# Patient Record
Sex: Male | Born: 1970
Health system: Southern US, Community
[De-identification: ages and names within clinical notes are randomized; demographics above are authoritative.]

## PROBLEM LIST (undated history)

## (undated) ENCOUNTER — Ambulatory Visit: Discharge status: Absent without leave | Payer: BC Managed Care – PPO

## (undated) DIAGNOSIS — M2540 Effusion, unspecified joint: Secondary | ICD-10-CM

## (undated) DIAGNOSIS — M2550 Pain in unspecified joint: Secondary | ICD-10-CM

## (undated) DIAGNOSIS — Z8709 Personal history of other diseases of the respiratory system: Secondary | ICD-10-CM

## (undated) HISTORY — PX: COLONOSCOPY: SHX174

---

## 2002-06-08 ENCOUNTER — Emergency Department (HOSPITAL_COMMUNITY): Admission: EM | Admit: 2002-06-08 | Discharge: 2002-06-08 | Payer: Self-pay | Admitting: Emergency Medicine

## 2005-06-23 ENCOUNTER — Emergency Department (HOSPITAL_COMMUNITY): Admission: EM | Admit: 2005-06-23 | Discharge: 2005-06-23 | Payer: Self-pay | Admitting: *Deleted

## 2006-04-03 ENCOUNTER — Ambulatory Visit: Payer: Self-pay | Admitting: Internal Medicine

## 2006-04-09 ENCOUNTER — Ambulatory Visit (HOSPITAL_COMMUNITY): Admission: RE | Admit: 2006-04-09 | Discharge: 2006-04-09 | Payer: Self-pay | Admitting: Internal Medicine

## 2006-04-09 ENCOUNTER — Ambulatory Visit: Payer: Self-pay | Admitting: Internal Medicine

## 2012-03-24 ENCOUNTER — Emergency Department (HOSPITAL_COMMUNITY)
Admission: EM | Admit: 2012-03-24 | Discharge: 2012-03-24 | Disposition: A | Discharge status: Home / Self Care | Payer: Worker's Compensation | Attending: Emergency Medicine | Admitting: Emergency Medicine

## 2012-03-24 ENCOUNTER — Emergency Department (HOSPITAL_COMMUNITY): Payer: Worker's Compensation

## 2012-03-24 ENCOUNTER — Encounter (HOSPITAL_COMMUNITY): Payer: Self-pay | Admitting: Emergency Medicine

## 2012-03-24 DIAGNOSIS — Z87891 Personal history of nicotine dependence: Secondary | ICD-10-CM | POA: Insufficient documentation

## 2012-03-24 DIAGNOSIS — W12XXXA Fall on and from scaffolding, initial encounter: Secondary | ICD-10-CM | POA: Insufficient documentation

## 2012-03-24 DIAGNOSIS — S43016A Anterior dislocation of unspecified humerus, initial encounter: Secondary | ICD-10-CM | POA: Insufficient documentation

## 2012-03-24 DIAGNOSIS — S43015A Anterior dislocation of left humerus, initial encounter: Secondary | ICD-10-CM

## 2012-03-24 DIAGNOSIS — Y929 Unspecified place or not applicable: Secondary | ICD-10-CM | POA: Insufficient documentation

## 2012-03-24 DIAGNOSIS — Y939 Activity, unspecified: Secondary | ICD-10-CM | POA: Insufficient documentation

## 2012-03-24 DIAGNOSIS — S42309A Unspecified fracture of shaft of humerus, unspecified arm, initial encounter for closed fracture: Secondary | ICD-10-CM

## 2012-03-24 MED ORDER — MIDAZOLAM HCL 2 MG/2ML IJ SOLN: 4.0000 mg | Freq: Once | INTRAMUSCULAR | Status: DC

## 2012-03-24 MED ORDER — PROPOFOL 10 MG/ML IV EMUL
INTRAVENOUS | Status: AC | PRN
  Administered 2012-03-24: 20 mL via INTRAVENOUS

## 2012-03-24 MED ORDER — MIDAZOLAM HCL 2 MG/2ML IJ SOLN
INTRAMUSCULAR | Status: AC
  Filled 2012-03-24: qty 8

## 2012-03-24 MED ORDER — MIDAZOLAM HCL 2 MG/2ML IJ SOLN
INTRAMUSCULAR | Status: AC | PRN
  Administered 2012-03-24: 4 mg via INTRAVENOUS

## 2012-03-24 MED ORDER — METHOCARBAMOL 500 MG PO TABS: 500.0000 mg | ORAL_TABLET | Freq: Three times a day (TID) | ORAL | Status: DC | PRN

## 2012-03-24 MED ORDER — PROPOFOL 10 MG/ML IV BOLUS
INTRAVENOUS | Status: AC | PRN
  Administered 2012-03-24: 20 mg via INTRAVENOUS

## 2012-03-24 MED ORDER — MORPHINE SULFATE 4 MG/ML IJ SOLN
6.0000 mg | Freq: Once | INTRAMUSCULAR | Status: AC
  Administered 2012-03-24: 6 mg via INTRAVENOUS
  Filled 2012-03-24: qty 2

## 2012-03-24 MED ORDER — OXYCODONE-ACETAMINOPHEN 5-325 MG PO TABS: 1.0000 | ORAL_TABLET | Freq: Four times a day (QID) | ORAL | Status: DC | PRN

## 2012-03-24 MED ORDER — ONDANSETRON HCL 4 MG/2ML IJ SOLN
4.0000 mg | Freq: Once | INTRAMUSCULAR | Status: AC
  Administered 2012-03-24: 4 mg via INTRAVENOUS
  Filled 2012-03-24: qty 2

## 2012-03-24 MED ORDER — HYDROMORPHONE HCL PF 1 MG/ML IJ SOLN
1.0000 mg | Freq: Once | INTRAMUSCULAR | Status: AC
  Administered 2012-03-24: 1 mg via INTRAVENOUS
  Filled 2012-03-24: qty 1

## 2012-03-24 MED ORDER — PROPOFOL 10 MG/ML IV BOLUS
INTRAVENOUS | Status: AC
  Filled 2012-03-24: qty 20

## 2012-03-24 NOTE — ED Notes (Signed)
Pt needed to use restroom. Notified of need to go over  D/c paperwork.  Pt left without d/c orders being completely explained, only partially.

## 2012-03-24 NOTE — Consult Note (Signed)
Reason for Consult:Left shoulder dislocation Referring Physician: EDP  DAREON NUNZIATO is an 42 y.o. male.  HPI: 42 yo male who fell at [employer] today sustaining a left shoulder dislocation.  Patient unable to move the arm after the fall. Patient complains of numbness on the lateral arm.  Patient transported to [facility] ED for further eval and treatment.  History reviewed. No pertinent past medical history.  History reviewed. No pertinent past surgical history.  No family history on file.  Social History:  reports that he has quit smoking. He does not have any smokeless tobacco history on file. He reports that he drinks alcohol. He reports that he does not use illicit drugs.  Allergies: No Known Allergies  Medications: I have reviewed the patient's current medications.  No results found for this or any previous visit (from the past 48 hour(s)).  Dg Cervical Spine Complete  03/24/2012  *RADIOLOGY REPORT*  Clinical Data: Fall with severe left sided pain.  CERVICAL SPINE - COMPLETE 4+ VIEW  Comparison: None.  Findings: Five views study shows no evidence for an acute fracture or subluxation.  Swimmer's view cannot be obtained secondary to left shoulder injury.  Loss of disc height is seen at C5-6 and C6-7 with associated endplate degeneration.  The frontal and bilateral oblique films show normal alignment at the C7-T1 interspace. The facets are well-aligned bilaterally.  No evidence for prevertebral soft tissue swelling.  IMPRESSION: No evidence for acute cervical spine fracture.   Original Report Authenticated By: Kennith Center, M.D.    Dg Shoulder Left  03/24/2012  *RADIOLOGY REPORT*  Clinical Data: Fall with left shoulder pain.  LEFT SHOULDER - 2+ VIEW  Comparison: None.  Findings: Two-view exam shows evidence of anterior subcoracoid humeral head dislocation with associated fracture of the humeral head against the glenoid.  IMPRESSION: Anterior shoulder dislocation with associated  comminuted humeral head fracture.   Original Report Authenticated By: Kennith Center, M.D.    Dg Shoulder Left Port  03/24/2012  *RADIOLOGY REPORT*  Clinical Data: Reduction of left shoulder dislocation.  PORTABLE LEFT SHOULDER - 2+ VIEW  Comparison: Earlier film, same date.  Findings: Interval reduction of the humeral head dislocation.  A greater tuberosity fracture is noted.  IMPRESSION: Reduction of left shoulder dislocation. Mildly displaced greater tuberosity fracture.   Original Report Authenticated By: Rudie Meyer, M.D.     ROS Blood pressure 164/110, pulse 95, temperature 98.3 F (36.8 C), temperature source Oral, resp. rate 21, SpO2 98.00%. Physical Exam  Left shoulder moderately swollen and tender. Incomplete numbness in the axillary nerve distribution. Able to fire the deltoid against resistance but weak and with some pain. Shoulder is clinically located. Shoulder is in a sling. NVI distally.  Assessment/Plan: Left shoulder dislocation with greater tuberosity fracture. After the reduction of the shoulder by the EDP, the position of the fractured tuberosity is much improved, however it is still minimally displaced.  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  I am recommending a CT scan to evaluate the amount of residual displacement.  They have had a long day and would like to go home.  I suggested that we can get the scan done in [place] where they are from and I can call them with the results and to discuss the plan for the shoulder.  All are in agreement.  Discussed with the EDP Dr Rubin Payor who is providing pain meds and muscle relaxers. Patient's cell phone is [phone]  Cautioned the patient to keep his arm across his body in the sling and gave them my card for contact info.  Vicy Medico,STEVEN R 03/24/2012, 5:48 PM

## 2012-03-24 NOTE — ED Notes (Signed)
Stepped thru some scaffoling and he hurt  Left arm . Landed on left shoulder good pulse  And can wiggle fingers but cannot move arm

## 2012-03-24 NOTE — ED Notes (Signed)
Patient transported to X-ray 

## 2012-03-24 NOTE — ED Notes (Signed)
Great pain to L shoulder with deformity.  No obvious broken bones.  Radial/unlnar pulses present.  + cap refill.  Pt denies loc or neck pain.  Pt did not fall through flooring.  AO x 4.

## 2012-03-24 NOTE — ED Provider Notes (Signed)
History     CSN: [id number]  Arrival date & time 03/24/12  1111   First MD Initiated Contact with Patient 03/24/12 1146      No chief complaint on file.   (Consider location/radiation/quality/duration/timing/severity/associated sxs/prior treatment) The history is provided by the patient.  Noah Lawson is a 42 y.o. male here status post fall. He was about 5 feet up on a scaffolding and stepped in a hole and fell off. He landed on his left arm and had a lot of pain afterwards. No head injury or LOC. It otherwise healthy the   History reviewed. No pertinent past medical history.  History reviewed. No pertinent past surgical history.  No family history on file.  History  Substance Use Topics  . Smoking status: Former Games developer  . Smokeless tobacco: Not on file  . Alcohol Use: Yes     Comment: weekend drinker      Review of Systems  Musculoskeletal:       L shoulder pain   All other systems reviewed and are negative.    Allergies  Review of patient's allergies indicates no known allergies.  Home Medications  No current outpatient prescriptions on file.  BP 164/110  Pulse 95  Temp 98.3 F (36.8 C) (Oral)  Resp 21  SpO2 98%  Physical Exam  Nursing note and vitals reviewed. Constitutional: He is oriented to person, place, and time. He appears well-developed.       Uncomfortable   HENT:  Head: Normocephalic and atraumatic.  Mouth/Throat: Oropharynx is clear and moist.  Eyes: Conjunctivae normal are normal. Pupils are equal, round, and reactive to light.  Neck: Normal range of motion. Neck supple.       No midline tenderness   Cardiovascular: Normal rate, regular rhythm and normal heart sounds.   Pulmonary/Chest: Effort normal and breath sounds normal. No respiratory distress. He has no wheezes. He has no rales.  Abdominal: Soft. Bowel sounds are normal. He exhibits no distension. There is no tenderness. There is no rebound.  Musculoskeletal:       L humeral  head displaced anteriorly and inferiorly. Good sensation around shoulder. 2+ pulses. Able to wiggle fingers. No other extremity deformity  Neurological: He is alert and oriented to person, place, and time.  Skin: Skin is warm and dry.  Psychiatric: He has a normal mood and affect. His behavior is normal. Judgment and thought content normal.    ED Course  Reduction of dislocation Date/Time: 03/24/2012 2:20 PM Performed by: Richardean Canal Authorized by: Richardean Canal Consent: Verbal consent obtained. Written consent obtained. Risks and benefits: risks, benefits and alternatives were discussed Consent given by: patient Patient understanding: patient states understanding of the procedure being performed Patient consent: the patient's understanding of the procedure matches consent given Procedure consent: procedure consent matches procedure scheduled Relevant documents: relevant documents present and verified Test results: test results available and properly labeled Site marked: the operative site was marked Imaging studies: imaging studies available Patient identity confirmed: verbally with patient and arm band Patient sedated: yes Sedatives: see MAR for details Patient tolerance: Patient tolerated the procedure well with no immediate complications. Comments: Traction and counter traction used. Shoulder successfully reduced. No complications.    (including critical care time)  Procedural sedation Performed by: Silverio Lay, Aanvi Voyles Consent: Verbal consent obtained. Risks and benefits: risks, benefits and alternatives were discussed Required items: required blood products, implants, devices, and special equipment available Patient identity confirmed: arm band and provided demographic data Time  out: Immediately prior to procedure a "time out" was called to verify the correct patient, procedure, equipment, support staff and site/side marked as required.  Sedation type: moderate (conscious)  sedation NPO time confirmed and considedered  Sedatives: PROPOFOL  Physician Time at Bedside: 40 min   Vitals: Vital signs were monitored during sedation. Cardiac Monitor, pulse oximeter Patient tolerance: Patient tolerated the procedure well with no immediate complications. Comments: Pt with uneventful recovered. Returned to pre-procedural sedation baseline  Comments: I gave 220 mg in total in 20mg  aliquots. He was still not sedated enough so he was given versed 4mg  IV then was sedated.         Labs Reviewed - No data to display Dg Cervical Spine Complete  03/24/2012  *RADIOLOGY REPORT*  Clinical Data: Fall with severe left sided pain.  CERVICAL SPINE - COMPLETE 4+ VIEW  Comparison: None.  Findings: Five views study shows no evidence for an acute fracture or subluxation.  Swimmer's view cannot be obtained secondary to left shoulder injury.  Loss of disc height is seen at C5-6 and C6-7 with associated endplate degeneration.  The frontal and bilateral oblique films show normal alignment at the C7-T1 interspace. The facets are well-aligned bilaterally.  No evidence for prevertebral soft tissue swelling.  IMPRESSION: No evidence for acute cervical spine fracture.   Original Report Authenticated By: Kennith Center, M.D.    Dg Shoulder Left  03/24/2012  *RADIOLOGY REPORT*  Clinical Data: Fall with left shoulder pain.  LEFT SHOULDER - 2+ VIEW  Comparison: None.  Findings: Two-view exam shows evidence of anterior subcoracoid humeral head dislocation with associated fracture of the humeral head against the glenoid.  IMPRESSION: Anterior shoulder dislocation with associated comminuted humeral head fracture.   Original Report Authenticated By: Kennith Center, M.D.    Dg Shoulder Left Port  03/24/2012  *RADIOLOGY REPORT*  Clinical Data: Reduction of left shoulder dislocation.  PORTABLE LEFT SHOULDER - 2+ VIEW  Comparison: Earlier film, same date.  Findings: Interval reduction of the humeral head  dislocation.  A greater tuberosity fracture is noted.  IMPRESSION: Reduction of left shoulder dislocation. Mildly displaced greater tuberosity fracture.   Original Report Authenticated By: Rudie Meyer, M.D.      No diagnosis found.    MDM  Noah Lawson is a 42 y.o. male here with L shoulder pain s/p fall. Likely dislocation vs fracture. Will give pain meds and get xray and reassess.   1:20 PM Xray showed anterior shoulder dislocation with humeral head fracture. I talked to Dr. Ranell Patrick, who asked me to reduce the shoulder and call him back after repeat films and he will see the patient.   2 PM I performed conscious sedation as above. Reduced shoulder without any complications.   3:35 PM Repeat films obtained, I called Dr. Ranell Patrick again, who will see the patient. Dr. Rubin Payor in the ED aware.        Richardean Canal, MD 03/24/12 [phone]

## 2012-03-25 ENCOUNTER — Encounter (HOSPITAL_COMMUNITY): Payer: Self-pay | Admitting: Pharmacy Technician

## 2012-03-27 ENCOUNTER — Encounter (HOSPITAL_COMMUNITY)
Admission: RE | Admit: 2012-03-27 | Discharge: 2012-03-27 | Disposition: A | Discharge status: Home / Self Care | Payer: Worker's Compensation | Source: Ambulatory Visit | Attending: Orthopedic Surgery | Admitting: Orthopedic Surgery

## 2012-03-27 ENCOUNTER — Encounter (HOSPITAL_COMMUNITY): Payer: Self-pay

## 2012-03-27 DIAGNOSIS — Z01812 Encounter for preprocedural laboratory examination: Secondary | ICD-10-CM | POA: Insufficient documentation

## 2012-03-27 HISTORY — DX: Pain in unspecified joint: M25.50

## 2012-03-27 HISTORY — DX: Gilbert syndrome: E80.4

## 2012-03-27 HISTORY — DX: Effusion, unspecified joint: M25.40

## 2012-03-27 HISTORY — DX: Personal history of other diseases of the respiratory system: Z87.09

## 2012-03-27 LAB — CBC WITH DIFFERENTIAL/PLATELET
Eosinophils Relative: 7 % — ABNORMAL HIGH (ref 0–5)
Hemoglobin: 14.6 g/dL (ref 13.0–17.0)
Lymphocytes Relative: 25 % (ref 12–46)
Lymphs Abs: 1.4 10*3/uL (ref 0.7–4.0)
MCV: 97.7 fL (ref 78.0–100.0)
Monocytes Relative: 15 % — ABNORMAL HIGH (ref 3–12)
Neutrophils Relative %: 53 % (ref 43–77)
Platelets: 204 10*3/uL (ref 150–400)
RBC: 4.35 MIL/uL (ref 4.22–5.81)
WBC: 5.4 10*3/uL (ref 4.0–10.5)

## 2012-03-27 MED ORDER — CEFAZOLIN SODIUM-DEXTROSE 2-3 GM-% IV SOLR: 2.0000 g | INTRAVENOUS | Status: DC

## 2012-03-27 MED ORDER — CHLORHEXIDINE GLUCONATE 4 % EX LIQD: 60.0000 mL | Freq: Once | CUTANEOUS | Status: DC

## 2012-03-27 NOTE — Progress Notes (Signed)
Pt doesn't have a cardiologist  Denies ever having an echo/stress test/heart cath  Pt doesn't have a medical md  Denies ekg or cxr within past yr 

## 2012-03-27 NOTE — H&P (Signed)
  Noah Lawson is an 42 y.o. male.    Chief Complaint: left shoulder pain  HPI: Pt is a 42 y.o. male complaining of left shoulder pain s/p fall and dislocation/fracture.Pt had closed reduction of dislocation in the emergency department. Risks, benefits and expectations were discussed with the patient. Patient understand the risks, benefits and expectations and wishes to proceed with surgery.   PCP:  Default, Provider, MD  D/C Plans: Home   PMH: No past medical history on file.  PSH: No past surgical history on file.  Social History:  reports that he has quit smoking. He does not have any smokeless tobacco history on file. He reports that he drinks alcohol. He reports that he does not use illicit drugs.  Allergies:  No Known Allergies  Medications: No current facility-administered medications for this encounter.   Current Outpatient Prescriptions  Medication Sig Dispense Refill  . methocarbamol (ROBAXIN) 500 MG tablet Take 1 tablet (500 mg total) by mouth 3 (three) times daily as needed.  20 tablet  0  . oxyCODONE-acetaminophen (PERCOCET/ROXICET) 5-325 MG per tablet Take 1-2 tablets by mouth every 6 (six) hours as needed for pain.  20 tablet  0    No results found for this or any previous visit (from the past 48 hour(s)). No results found.  ROS: Pain with rom of the left upper extremity Otherwise ros negative  Physical Exam: Alert and oriented 42 y.o. male in no acute distress Cranial nerves 2-12 intact Cervical spine: full rom with no tenderness, nv intact distally Chest: active breath sounds bilaterally, no wheeze rhonchi or rales Heart: regular rate and rhythm, no murmur Abd: non tender non distended with active bowel sounds Hip is stable with rom  Left shoulder with guarded rom due to recent fx/dislocation nv intact distally  X-rays: left greater tuberosity fracture s/p dislocation  Assessment/Plan Assessment: Left greater tuberosity  fracture  Plan: Patient will undergo a left proximal humerus ORIF by Dr. Ranell Patrick at [facility]. Risks benefits and expectations were discussed with the patient. Patient understand risks, benefits and expectations and wishes to proceed.

## 2012-03-27 NOTE — Pre-Procedure Instructions (Signed)
MACEO HERNAN  03/27/2012   Your procedure is scheduled on:  Fri, Jan 31 @ 12:00 PM  Report to [facility] at 10:00 AM.  Call this number if you have problems the morning of surgery: [phone]   Remember:   Do not eat food or drink liquids after midnight.   Take these medicines the morning of surgery with A SIP OF WATER: Pain Pill(if needed) and Robaxin(Methocarbamol)   Do not wear jewelry.  Do not wear lotions, powders, or colognes. You may wear deodorant.  Men may shave face and neck.  Do not bring valuables to the hospital.  Contacts, dentures or bridgework may not be worn into surgery.  Leave suitcase in the car. After surgery it may be brought to your room.  For patients admitted to the hospital, checkout time is 11:00 AM the day of  discharge.   Patients discharged the day of surgery will not be allowed to drive  home.    Special Instructions: Shower using CHG 2 nights before surgery and the night before surgery.  If you shower the day of surgery use CHG.  Use special wash - you have one bottle of CHG for all showers.  You should use approximately 1/3 of the bottle for each shower.   Please read over the following fact sheets that you were given: Pain Booklet, Coughing and Deep Breathing, MRSA Information and Surgical Site Infection Prevention

## 2012-03-28 ENCOUNTER — Encounter (HOSPITAL_COMMUNITY): Admission: RE | Payer: Self-pay | Source: Ambulatory Visit

## 2012-03-28 ENCOUNTER — Ambulatory Visit (HOSPITAL_COMMUNITY)
Admission: RE | Admit: 2012-03-28 | Payer: Worker's Compensation | Source: Ambulatory Visit | Admitting: Orthopedic Surgery

## 2012-03-28 SURGERY — OPEN REDUCTION INTERNAL FIXATION (ORIF) PROXIMAL HUMERUS FRACTURE
Anesthesia: Choice | Site: Shoulder | Laterality: Left

## 2014-02-24 IMAGING — CR DG SHOULDER 2+V*L*
2 series · 2 of 2 positions shown · non-contrast
Comparison: None.

CLINICAL DATA: Fall with left shoulder pain.

LEFT SHOULDER - 2+ VIEW

[w shoulder ap internal left]
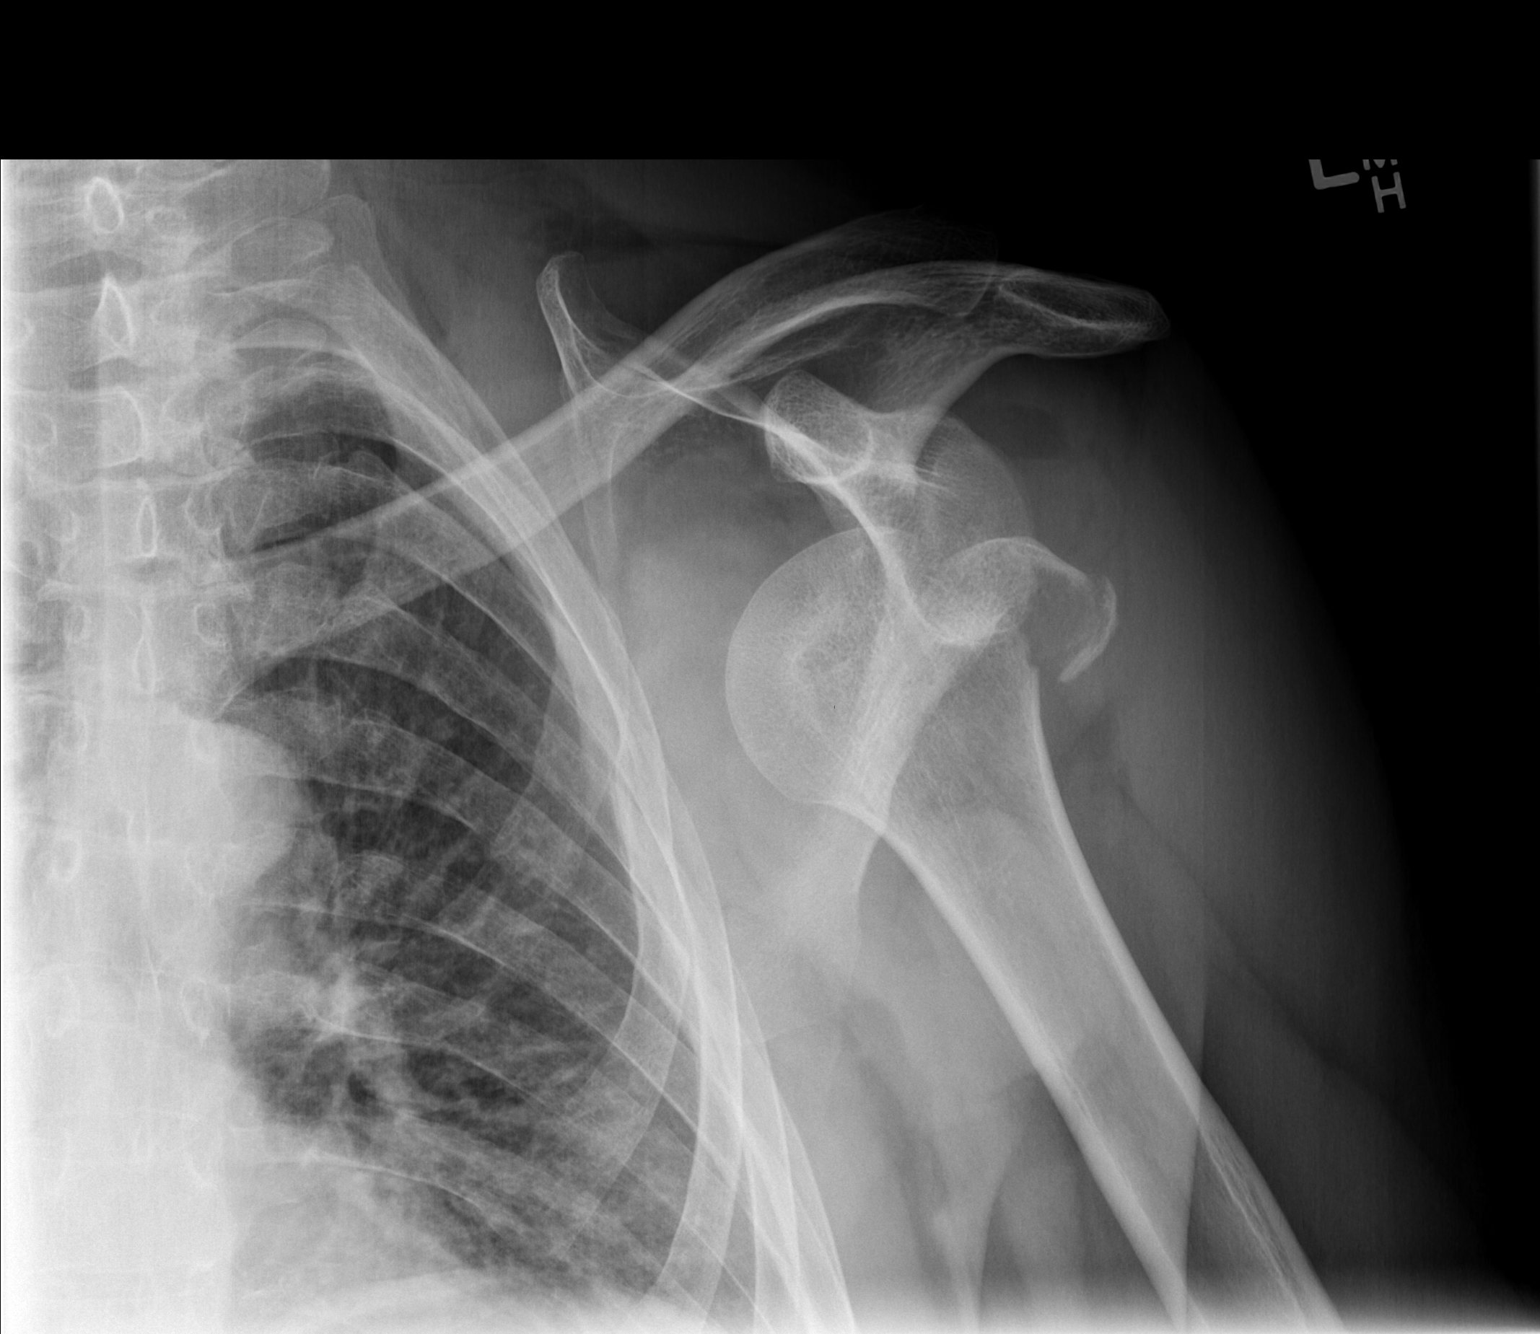

[w shoulder y view left]
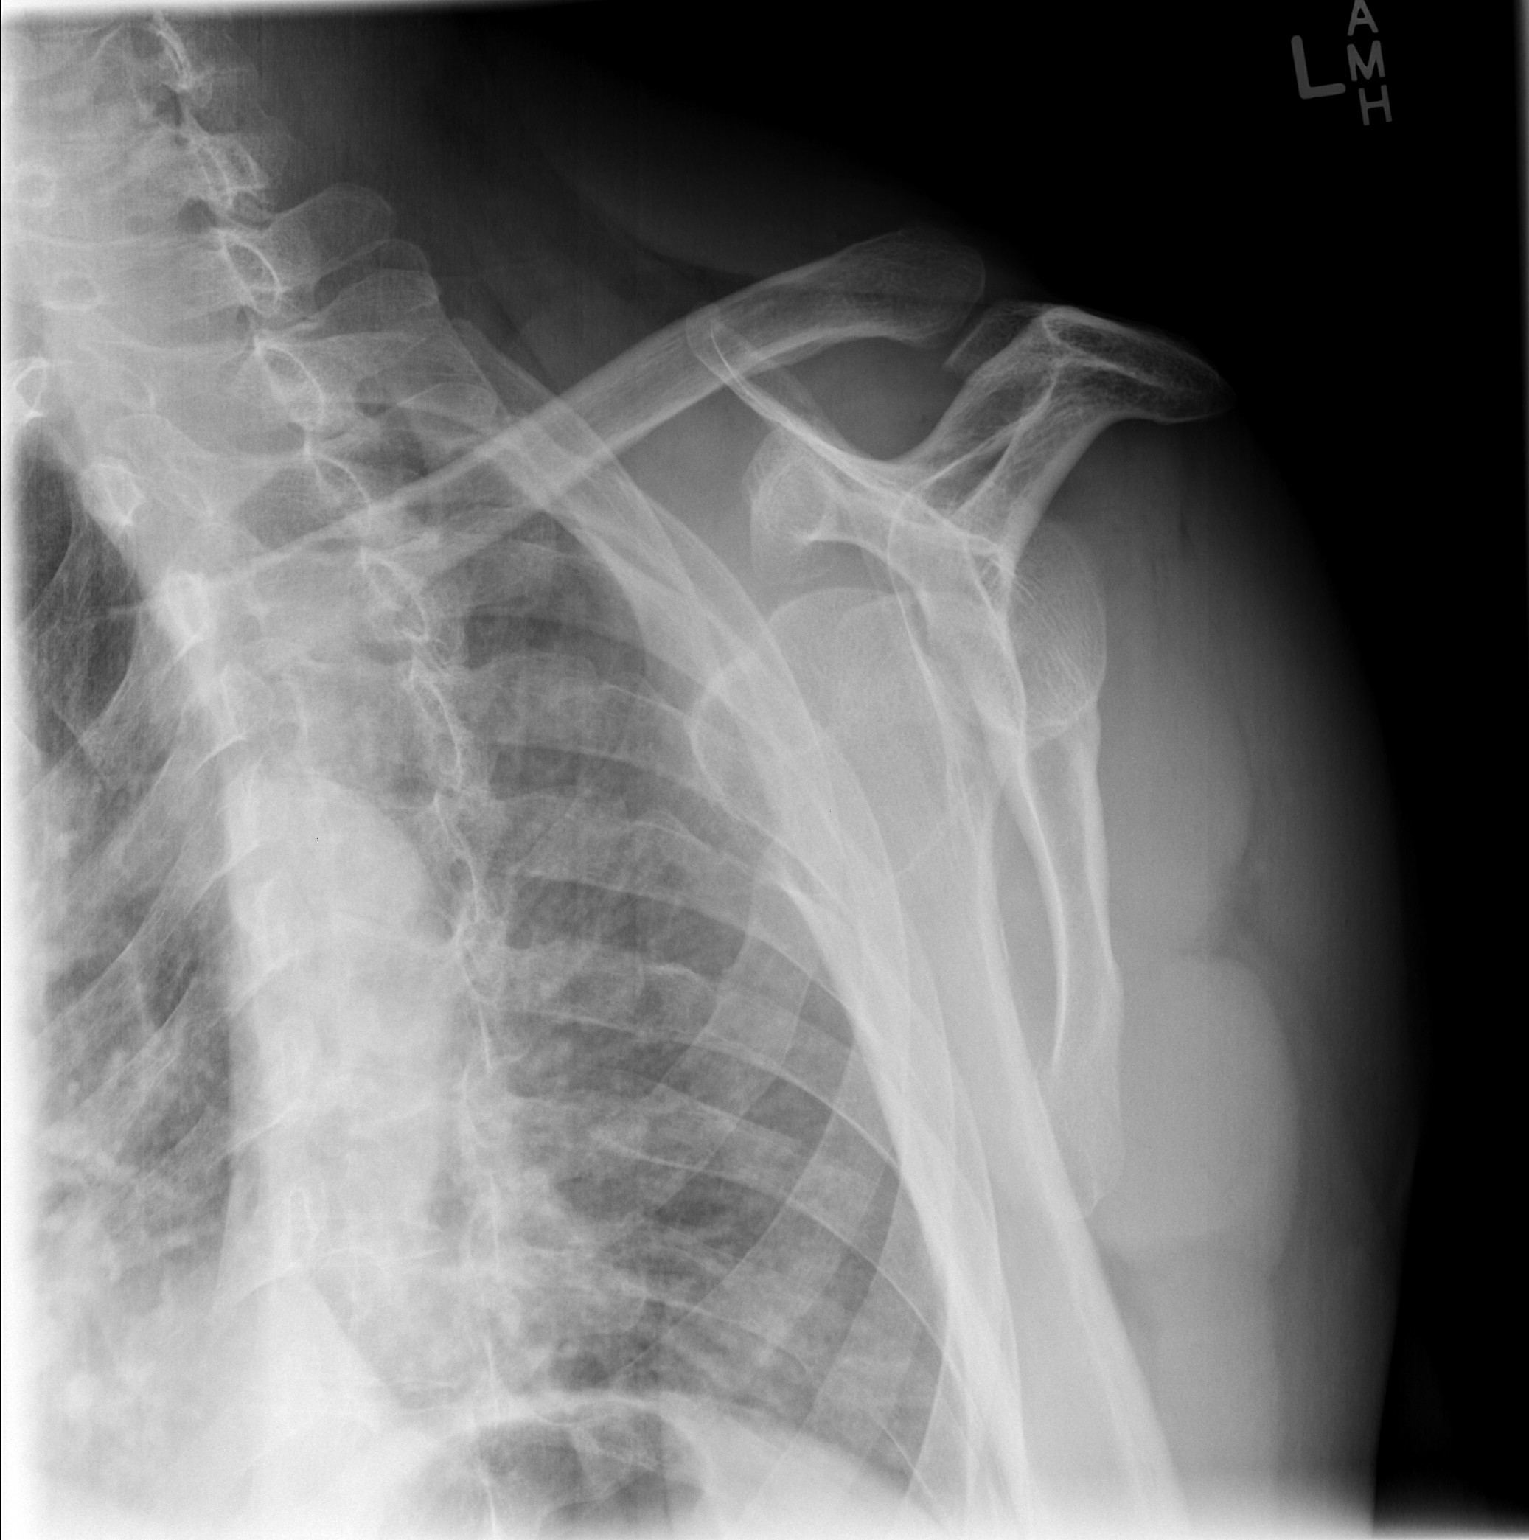

[2 of 2 positions shown; findings below may reference images not displayed]

FINDINGS: Two-view exam shows evidence of anterior subcoracoid
humeral head dislocation with associated fracture of the humeral
head against the glenoid.
IMPRESSION: Anterior shoulder dislocation with associated comminuted humeral
head fracture..

## 2017-02-11 DIAGNOSIS — R7301 Impaired fasting glucose: Secondary | ICD-10-CM | POA: Diagnosis not present

## 2017-02-11 DIAGNOSIS — I10 Essential (primary) hypertension: Secondary | ICD-10-CM | POA: Diagnosis not present

## 2017-02-11 DIAGNOSIS — E782 Mixed hyperlipidemia: Secondary | ICD-10-CM | POA: Diagnosis not present

## 2017-02-14 DIAGNOSIS — Z0000 Encounter for general adult medical examination without abnormal findings: Secondary | ICD-10-CM | POA: Diagnosis not present

## 2017-02-28 DIAGNOSIS — I10 Essential (primary) hypertension: Secondary | ICD-10-CM | POA: Diagnosis not present

## 2017-04-04 DIAGNOSIS — Z6829 Body mass index (BMI) 29.0-29.9, adult: Secondary | ICD-10-CM | POA: Diagnosis not present

## 2017-04-04 DIAGNOSIS — I10 Essential (primary) hypertension: Secondary | ICD-10-CM | POA: Diagnosis not present

## 2017-04-18 DIAGNOSIS — I10 Essential (primary) hypertension: Secondary | ICD-10-CM | POA: Diagnosis not present

## 2017-05-02 DIAGNOSIS — Z6830 Body mass index (BMI) 30.0-30.9, adult: Secondary | ICD-10-CM | POA: Diagnosis not present

## 2017-05-02 DIAGNOSIS — J019 Acute sinusitis, unspecified: Secondary | ICD-10-CM | POA: Diagnosis not present

## 2017-05-02 DIAGNOSIS — R05 Cough: Secondary | ICD-10-CM | POA: Diagnosis not present

## 2018-03-19 DIAGNOSIS — Z6830 Body mass index (BMI) 30.0-30.9, adult: Secondary | ICD-10-CM | POA: Diagnosis not present

## 2018-03-19 DIAGNOSIS — I10 Essential (primary) hypertension: Secondary | ICD-10-CM | POA: Diagnosis not present

## 2018-03-25 DIAGNOSIS — I10 Essential (primary) hypertension: Secondary | ICD-10-CM | POA: Diagnosis not present

## 2018-03-25 DIAGNOSIS — R945 Abnormal results of liver function studies: Secondary | ICD-10-CM | POA: Diagnosis not present

## 2018-03-25 DIAGNOSIS — Z0001 Encounter for general adult medical examination with abnormal findings: Secondary | ICD-10-CM | POA: Diagnosis not present

## 2018-03-25 DIAGNOSIS — E782 Mixed hyperlipidemia: Secondary | ICD-10-CM | POA: Diagnosis not present

## 2018-10-17 DIAGNOSIS — I10 Essential (primary) hypertension: Secondary | ICD-10-CM | POA: Diagnosis not present

## 2018-10-24 DIAGNOSIS — E782 Mixed hyperlipidemia: Secondary | ICD-10-CM | POA: Diagnosis not present

## 2018-10-24 DIAGNOSIS — R945 Abnormal results of liver function studies: Secondary | ICD-10-CM | POA: Diagnosis not present

## 2018-10-24 DIAGNOSIS — F1020 Alcohol dependence, uncomplicated: Secondary | ICD-10-CM | POA: Diagnosis not present

## 2018-10-24 DIAGNOSIS — I10 Essential (primary) hypertension: Secondary | ICD-10-CM | POA: Diagnosis not present

## 2018-11-19 ENCOUNTER — Encounter (INDEPENDENT_AMBULATORY_CARE_PROVIDER_SITE_OTHER): Payer: Self-pay | Admitting: *Deleted

## 2018-11-26 ENCOUNTER — Other Ambulatory Visit (INDEPENDENT_AMBULATORY_CARE_PROVIDER_SITE_OTHER): Payer: Self-pay | Admitting: *Deleted

## 2018-11-26 DIAGNOSIS — Z1211 Encounter for screening for malignant neoplasm of colon: Secondary | ICD-10-CM | POA: Insufficient documentation

## 2018-12-27 DIAGNOSIS — Z20828 Contact with and (suspected) exposure to other viral communicable diseases: Secondary | ICD-10-CM | POA: Diagnosis not present

## 2018-12-27 DIAGNOSIS — Z1159 Encounter for screening for other viral diseases: Secondary | ICD-10-CM | POA: Diagnosis not present

## 2018-12-30 ENCOUNTER — Encounter (INDEPENDENT_AMBULATORY_CARE_PROVIDER_SITE_OTHER): Payer: Self-pay | Admitting: *Deleted

## 2018-12-30 ENCOUNTER — Other Ambulatory Visit (INDEPENDENT_AMBULATORY_CARE_PROVIDER_SITE_OTHER): Payer: Self-pay | Admitting: *Deleted

## 2018-12-30 ENCOUNTER — Telehealth (INDEPENDENT_AMBULATORY_CARE_PROVIDER_SITE_OTHER): Payer: Self-pay | Admitting: *Deleted

## 2018-12-30 DIAGNOSIS — Z1211 Encounter for screening for malignant neoplasm of colon: Secondary | ICD-10-CM

## 2018-12-30 MED ORDER — SUPREP BOWEL PREP KIT 17.5-3.13-1.6 GM/177ML PO SOLN: 1.0000 | Freq: Once | ORAL | 0 refills | Status: AC

## 2018-12-30 NOTE — Telephone Encounter (Signed)
Patient needs suprep TCS sch'd 12/31

## 2019-01-26 ENCOUNTER — Other Ambulatory Visit: Payer: Self-pay

## 2019-01-26 ENCOUNTER — Ambulatory Visit (INDEPENDENT_AMBULATORY_CARE_PROVIDER_SITE_OTHER): Payer: Self-pay

## 2019-01-26 ENCOUNTER — Telehealth (INDEPENDENT_AMBULATORY_CARE_PROVIDER_SITE_OTHER): Payer: Self-pay | Admitting: *Deleted

## 2019-01-26 NOTE — Telephone Encounter (Signed)
Referring MD/PCP: hall   Procedure: tcs  Reason/Indication:  screening  Has patient had this procedure before?  Yes, 2010  If so, when, by whom and where?    Is there a family history of colon cancer?  no  Who?  What age when diagnosed?    Is patient diabetic?   no      Does patient have prosthetic heart valve or mechanical valve?  no  Do you have a pacemaker/defibrillator?  no  Has patient ever had endocarditis/atrial fibrillation? no  Does patient use oxygen? no  Has patient had joint replacement within last 12 months?  no  Is patient constipated or do they take laxatives? no  Does patient have a history of alcohol/drug use?  no  Is patient on blood thinner such as Coumadin, Plavix and/or Aspirin? no  Medications: olmesartan/hctz 40/12.5 mg daily, amlodipine 5 mg daily  Allergies: nkda  Medication Adjustment per Dr Laural Golden:   Procedure date & time: 12/31 at 730

## 2019-01-30 DIAGNOSIS — R945 Abnormal results of liver function studies: Secondary | ICD-10-CM | POA: Diagnosis not present

## 2019-01-30 DIAGNOSIS — R7301 Impaired fasting glucose: Secondary | ICD-10-CM | POA: Diagnosis not present

## 2019-01-30 DIAGNOSIS — E782 Mixed hyperlipidemia: Secondary | ICD-10-CM | POA: Diagnosis not present

## 2019-01-30 DIAGNOSIS — I10 Essential (primary) hypertension: Secondary | ICD-10-CM | POA: Diagnosis not present

## 2019-01-30 DIAGNOSIS — F1020 Alcohol dependence, uncomplicated: Secondary | ICD-10-CM | POA: Diagnosis not present

## 2019-02-02 NOTE — Telephone Encounter (Signed)
Colonoscopy with conscious sedation 

## 2019-02-06 DIAGNOSIS — Z23 Encounter for immunization: Secondary | ICD-10-CM | POA: Diagnosis not present

## 2019-02-06 DIAGNOSIS — R945 Abnormal results of liver function studies: Secondary | ICD-10-CM | POA: Diagnosis not present

## 2019-02-06 DIAGNOSIS — I10 Essential (primary) hypertension: Secondary | ICD-10-CM | POA: Diagnosis not present

## 2019-02-06 DIAGNOSIS — E782 Mixed hyperlipidemia: Secondary | ICD-10-CM | POA: Diagnosis not present

## 2019-02-21 DIAGNOSIS — Z20828 Contact with and (suspected) exposure to other viral communicable diseases: Secondary | ICD-10-CM | POA: Diagnosis not present

## 2019-02-21 DIAGNOSIS — Z03818 Encounter for observation for suspected exposure to other biological agents ruled out: Secondary | ICD-10-CM | POA: Diagnosis not present

## 2019-02-24 ENCOUNTER — Other Ambulatory Visit (HOSPITAL_COMMUNITY)
Admission: RE | Admit: 2019-02-24 | Discharge: 2019-02-24 | Disposition: A | Discharge status: Home / Self Care | Payer: BC Managed Care – PPO | Source: Ambulatory Visit | Attending: Internal Medicine | Admitting: Internal Medicine

## 2019-02-24 ENCOUNTER — Encounter (HOSPITAL_COMMUNITY): Payer: Self-pay | Admitting: Anesthesiology

## 2019-02-24 ENCOUNTER — Other Ambulatory Visit: Payer: Self-pay

## 2019-02-24 DIAGNOSIS — Z20828 Contact with and (suspected) exposure to other viral communicable diseases: Secondary | ICD-10-CM | POA: Insufficient documentation

## 2019-02-24 DIAGNOSIS — Z01812 Encounter for preprocedural laboratory examination: Secondary | ICD-10-CM | POA: Diagnosis not present

## 2019-02-25 ENCOUNTER — Other Ambulatory Visit (HOSPITAL_COMMUNITY)
Admission: RE | Admit: 2019-02-25 | Discharge: 2019-02-25 | Disposition: A | Discharge status: Home / Self Care | Payer: BC Managed Care – PPO | Source: Ambulatory Visit | Attending: Internal Medicine | Admitting: Internal Medicine

## 2019-02-25 LAB — SARS CORONAVIRUS 2 (TAT 6-24 HRS): SARS Coronavirus 2: NEGATIVE

## 2019-02-26 ENCOUNTER — Encounter (HOSPITAL_COMMUNITY): Payer: Self-pay | Admitting: Internal Medicine

## 2019-02-26 ENCOUNTER — Encounter (HOSPITAL_COMMUNITY)
Admission: RE | Disposition: A | Discharge status: Home / Self Care | Payer: Self-pay | Source: Home / Self Care | Attending: Internal Medicine

## 2019-02-26 ENCOUNTER — Other Ambulatory Visit: Payer: Self-pay

## 2019-02-26 ENCOUNTER — Ambulatory Visit (HOSPITAL_COMMUNITY)
Admission: RE | Admit: 2019-02-26 | Discharge: 2019-02-26 | Disposition: A | Discharge status: Home / Self Care | Payer: BC Managed Care – PPO | Attending: Internal Medicine | Admitting: Internal Medicine

## 2019-02-26 DIAGNOSIS — D123 Benign neoplasm of transverse colon: Secondary | ICD-10-CM | POA: Insufficient documentation

## 2019-02-26 DIAGNOSIS — Z79899 Other long term (current) drug therapy: Secondary | ICD-10-CM | POA: Insufficient documentation

## 2019-02-26 DIAGNOSIS — Z1211 Encounter for screening for malignant neoplasm of colon: Secondary | ICD-10-CM | POA: Insufficient documentation

## 2019-02-26 DIAGNOSIS — K644 Residual hemorrhoidal skin tags: Secondary | ICD-10-CM | POA: Diagnosis not present

## 2019-02-26 DIAGNOSIS — K5730 Diverticulosis of large intestine without perforation or abscess without bleeding: Secondary | ICD-10-CM | POA: Insufficient documentation

## 2019-02-26 DIAGNOSIS — Z87891 Personal history of nicotine dependence: Secondary | ICD-10-CM | POA: Insufficient documentation

## 2019-02-26 DIAGNOSIS — K6289 Other specified diseases of anus and rectum: Secondary | ICD-10-CM | POA: Diagnosis not present

## 2019-02-26 HISTORY — PX: POLYPECTOMY: SHX5525

## 2019-02-26 HISTORY — PX: COLONOSCOPY: SHX5424

## 2019-02-26 SURGERY — COLONOSCOPY
Anesthesia: Moderate Sedation

## 2019-02-26 MED ORDER — SODIUM CHLORIDE 0.9 % IV SOLN: INTRAVENOUS | Status: DC

## 2019-02-26 MED ORDER — MIDAZOLAM HCL 5 MG/5ML IJ SOLN
INTRAMUSCULAR | Status: DC | PRN
  Administered 2019-02-26 (×4): 2 mg via INTRAVENOUS

## 2019-02-26 MED ORDER — MEPERIDINE HCL 50 MG/ML IJ SOLN
INTRAMUSCULAR | Status: DC | PRN
  Administered 2019-02-26 (×3): 25 mg via INTRAVENOUS

## 2019-02-26 MED ORDER — MEPERIDINE HCL 50 MG/ML IJ SOLN
INTRAMUSCULAR | Status: AC
  Filled 2019-02-26: qty 1

## 2019-02-26 MED ORDER — MIDAZOLAM HCL 5 MG/5ML IJ SOLN
INTRAMUSCULAR | Status: AC
  Filled 2019-02-26: qty 10

## 2019-02-26 MED ORDER — STERILE WATER FOR IRRIGATION IR SOLN
Status: DC | PRN
  Administered 2019-02-26: 1.5 mL

## 2019-02-26 NOTE — Op Note (Signed)
[facility] Patient Name: Noah Lawson Procedure Date: 02/26/2019 11:41 AM MRN: [id number] Date of Birth: 08-15-1970 Attending MD: Hildred Laser , MD CSN: [id number] Age: 48 Admit Type: Outpatient Procedure:                Colonoscopy Indications:              Screening for colorectal malignant neoplasm Providers:                Hildred Laser, MD, Otis Peak B. Sharon Seller, RN, Raphael Gibney, Technician Referring MD:             Delphina Cahill, MD Medicines:                Meperidine 75 mg IV, Midazolam 8 mg IV Complications:            No immediate complications. Estimated Blood Loss:     Estimated blood loss was minimal. Procedure:                Pre-Anesthesia Assessment:                           - Prior to the procedure, a History and Physical                            was performed, and patient medications and                            allergies were reviewed. The patient's tolerance of                            previous anesthesia was also reviewed. The risks                            and benefits of the procedure and the sedation                            options and risks were discussed with the patient.                            All questions were answered, and informed consent                            was obtained. Prior Anticoagulants: The patient has                            taken no previous anticoagulant or antiplatelet                            agents. ASA Grade Assessment: II - A patient with                            mild systemic disease. After reviewing the risks  and benefits, the patient was deemed in                            satisfactory condition to undergo the procedure.                           After obtaining informed consent, the colonoscope                            was passed under direct vision. Throughout the                            procedure, the patient's blood pressure, pulse, and                             oxygen saturations were monitored continuously. The                            [id number]-[id number] scope was introduced through                            the anus and advanced to the the cecum, identified                            by appendiceal orifice and ileocecal valve. The                            colonoscopy was performed without difficulty. The                            patient tolerated the procedure well. The quality                            of the bowel preparation was adequate to identify                            polyps. The ileocecal valve, appendiceal orifice,                            and rectum were photographed. Scope In: 12:13:04 PM Scope Out: 12:50:55 PM Scope Withdrawal Time: 0 hours 32 minutes 21 seconds  Total Procedure Duration: 0 hours 37 minutes 51 seconds  Findings:      The perianal and digital rectal examinations were normal.      Two small polyps were found in the transverse colon hepatic flexure. The       polyps were sessile. Biopsies were taken with a cold forceps for       histology. The pathology specimen was placed into Bottle Number 1.      A 7 mm polyp was found in the proximal transverse colon. The polyp was       sessile. The polyp was removed with a cold snare. Resection and       retrieval were complete. To stop active bleeding, one hemostatic clip       was successfully placed (MR conditional). There was  no bleeding at the       end of the procedure. Polyp placed in bottle no 1.      Two sessile polyps were found in the splenic flexure. The polyps were       small in size. These polyps were removed with a cold snare. Resection       and retrieval were complete. The pathology specimen was placed into       Bottle Number 1.      Scattered diverticula were found in the sigmoid colon.      External hemorrhoids were found during retroflexion. The hemorrhoids       were small.      Anal papilla(e) were  hypertrophied. Impression:               - Two small polyps in the transverse colon at the                            hepatic flexure. Biopsied.                           - One 7 mm polyp in the proximal transverse colon,                            removed with a cold snare. Resected and retrieved.                            Clip (MR conditional) was placed.                           - Two small polyps at the splenic flexure, removed                            with a cold snare. Resected and retrieved.                           - Diverticulosis in the sigmoid colon.                           - External hemorrhoids.                           - Anal papilla(e) were hypertrophied. Moderate Sedation:      Moderate (conscious) sedation was administered by the endoscopy nurse       and supervised by the endoscopist. The following parameters were       monitored: oxygen saturation, heart rate, blood pressure, CO2       capnography and response to care. Total physician intraservice time was       45 minutes. Recommendation:           - Patient has a contact number available for                            emergencies. The signs and symptoms of potential                            delayed complications were discussed with the  patient. Return to normal activities tomorrow.                            Written discharge instructions were provided to the                            patient.                           - High fiber diet today.                           - Continue present medications.                           - No aspirin, ibuprofen, naproxen, or other                            non-steroidal anti-inflammatory drugs for 1 day.                           - Await pathology results.                           - Repeat colonoscopy is recommended. The                            colonoscopy date will be determined after pathology                            results from  today's exam become available for                            review. Procedure Code(s):        --- Professional ---                           [phone], Colonoscopy, flexible; with removal of                            tumor(s), polyp(s), or other lesion(s) by snare                            technique                           45380, 59, Colonoscopy, flexible; with biopsy,                            single or multiple                           99153, Moderate sedation; each additional 15                            minutes intraservice time  [id number], Moderate sedation; each additional 15                            minutes intraservice time                           G0500, Moderate sedation services provided by the                            same physician or other qualified health care                            professional performing a gastrointestinal                            endoscopic service that sedation supports,                            requiring the presence of an independent trained                            observer to assist in the monitoring of the                            patient's level of consciousness and physiological                            status; initial 15 minutes of intra-service time;                            patient age 69 years or older (additional time may                            be reported with [phone], as appropriate) Diagnosis Code(s):        --- Professional ---                           K63.5, Polyp of colon                           Z12.11, Encounter for screening for malignant                            neoplasm of colon                           K64.4, Residual hemorrhoidal skin tags                           K62.89, Other specified diseases of anus and rectum                           K57.30, Diverticulosis of large intestine without                            perforation  or abscess without bleeding CPT copyright 2019  [employer]. All rights reserved. The codes documented in this report are preliminary and upon coder review may  be revised to meet current compliance requirements. Hildred Laser, MD Hildred Laser, MD 02/26/2019 1:06:12 PM This report has been signed electronically. Number of Addenda: 0

## 2019-02-26 NOTE — Discharge Instructions (Signed)
No aspirin or NSAIDs for 24 hours. Resume usual medications as before. High-fiber diet. No driving for 24 hours. Remember you cannot have an MRI until clip has passed. Physician  will call with biopsy results.   Colonoscopy, Adult, Care After This sheet gives you information about how to care for yourself after your procedure. Your doctor may also give you more specific instructions. If you have problems or questions, call your doctor. What can I expect after the procedure? After the procedure, it is common to have:  A small amount of blood in your poop (stool) for 24 hours.  Some gas.  Mild cramping or bloating in your belly (abdomen). Follow these instructions at home: Eating and drinking   Drink enough fluid to keep your pee (urine) pale yellow.  Follow instructions from your doctor about what you cannot eat or drink.  Return to your normal diet as told by your doctor. Avoid heavy or fried foods that are hard to digest. Activity  Rest as told by your doctor.  Do not sit for a long time without moving. Get up to take short walks every 1-2 hours. This is important. Ask for help if you feel weak or unsteady.  Return to your normal activities as told by your doctor. Ask your doctor what activities are safe for you. To help cramping and bloating:   Try walking around.  Put heat on your belly as told by your doctor. Use the heat source that your doctor recommends, such as a moist heat pack or a heating pad. ? Put a towel between your skin and the heat source. ? Leave the heat on for 20-30 minutes. ? Remove the heat if your skin turns bright red. This is very important if you are unable to feel pain, heat, or cold. You may have a greater risk of getting burned. General instructions  For the first 24 hours after the procedure: ? Do not drive or use machinery. ? Do not sign important documents. ? Do not drink alcohol. ? Do your daily activities more slowly than  normal. ? Eat foods that are soft and easy to digest.  Take over-the-counter or prescription medicines only as told by your doctor.  Keep all follow-up visits as told by your doctor. This is important. Contact a doctor if:  You have blood in your poop 2-3 days after the procedure. Get help right away if:  You have more than a small amount of blood in your poop.  You see large clumps of tissue (blood clots) in your poop.  Your belly is swollen.  You feel like you may vomit (nauseous).  You vomit.  You have a fever.  You have belly pain that gets worse, and medicine does not help your pain. Summary  After the procedure, it is common to have a small amount of blood in your poop. You may also have mild cramping and bloating in your belly.  For the first 24 hours after the procedure, do not drive or use machinery, do not sign important documents, and do not drink alcohol.  Get help right away if you have a lot of blood in your poop, feel like you may vomit, have a fever, or have more belly pain. This information is not intended to replace advice given to you by your health care provider. Make sure you discuss any questions you have with your health care provider. Document Revised: 09/08/2018 Document Reviewed: 09/08/2018 Elsevier Patient Education  [place].  Hemorrhoids  Hemorrhoids are swollen veins in and around the rectum or anus. There are two types of hemorrhoids:  Internal hemorrhoids. These occur in the veins that are just inside the rectum. They may poke through to the outside and become irritated and painful.  External hemorrhoids. These occur in the veins that are outside the anus and can be felt as a painful swelling or hard lump near the anus. Most hemorrhoids do not cause serious problems, and they can be managed with home treatments such as diet and lifestyle changes. If home treatments do not help the symptoms, procedures can be done to shrink or remove  the hemorrhoids. What are the causes? This condition is caused by increased pressure in the anal area. This pressure may result from various things, including:  Constipation.  Straining to have a bowel movement.  Diarrhea.  Pregnancy.  Obesity.  Sitting for long periods of time.  Heavy lifting or other activity that causes you to strain.  Anal sex.  Riding a bike for a long period of time. What are the signs or symptoms? Symptoms of this condition include:  Pain.  Anal itching or irritation.  Rectal bleeding.  Leakage of stool (feces).  Anal swelling.  One or more lumps around the anus. How is this diagnosed? This condition can often be diagnosed through a visual exam. Other exams or tests may also be done, such as:  An exam that involves feeling the rectal area with a gloved hand (digital rectal exam).  An exam of the anal canal that is done using a small tube (anoscope).  A blood test, if you have lost a significant amount of blood.  A test to look inside the colon using a flexible tube with a camera on the end (sigmoidoscopy or colonoscopy). How is this treated? This condition can usually be treated at home. However, various procedures may be done if dietary changes, lifestyle changes, and other home treatments do not help your symptoms. These procedures can help make the hemorrhoids smaller or remove them completely. Some of these procedures involve surgery, and others do not. Common procedures include:  Rubber band ligation. Rubber bands are placed at the base of the hemorrhoids to cut off their blood supply.  Sclerotherapy. Medicine is injected into the hemorrhoids to shrink them.  Infrared coagulation. A type of light energy is used to get rid of the hemorrhoids.  Hemorrhoidectomy surgery. The hemorrhoids are surgically removed, and the veins that supply them are tied off.  Stapled hemorrhoidopexy surgery. The surgeon staples the base of the hemorrhoid  to the rectal wall. Follow these instructions at home: Eating and drinking   Eat foods that have a lot of fiber in them, such as whole grains, beans, nuts, fruits, and vegetables.  Ask your health care provider about taking products that have added fiber (fiber supplements).  Reduce the amount of fat in your diet. You can do this by eating low-fat dairy products, eating less red meat, and avoiding processed foods.  Drink enough fluid to keep your urine pale yellow. Managing pain and swelling   Take warm sitz baths for 20 minutes, 3-4 times a day to ease pain and discomfort. You may do this in a bathtub or using a portable sitz bath that fits over the toilet.  If directed, apply ice to the affected area. Using ice packs between sitz baths may be helpful. ? Put ice in a plastic bag. ? Place a towel between your skin and the bag. ? Leave  the ice on for 20 minutes, 2-3 times a day. General instructions  Take over-the-counter and prescription medicines only as told by your health care provider.  Use medicated creams or suppositories as told.  Get regular exercise. Ask your health care provider how much and what kind of exercise is best for you. In general, you should do moderate exercise for at least 30 minutes on most days of the week (150 minutes each week). This can include activities such as walking, biking, or yoga.  Go to the bathroom when you have the urge to have a bowel movement. Do not wait.  Avoid straining to have bowel movements.  Keep the anal area dry and clean. Use wet toilet paper or moist towelettes after a bowel movement.  Do not sit on the toilet for long periods of time. This increases blood pooling and pain.  Keep all follow-up visits as told by your health care provider. This is important. Contact a health care provider if you have:  Increasing pain and swelling that are not controlled by treatment or medicine.  Difficulty having a bowel movement, or you  are unable to have a bowel movement.  Pain or inflammation outside the area of the hemorrhoids. Get help right away if you have:  Uncontrolled bleeding from your rectum. Summary  Hemorrhoids are swollen veins in and around the rectum or anus.  Most hemorrhoids can be managed with home treatments such as diet and lifestyle changes.  Taking warm sitz baths can help ease pain and discomfort.  In severe cases, procedures or surgery can be done to shrink or remove the hemorrhoids. This information is not intended to replace advice given to you by your health care provider. Make sure you discuss any questions you have with your health care provider. Document Revised: 07/11/2018 Document Reviewed: 07/04/2017 Elsevier Patient Education  [place].  Diverticulosis  Diverticulosis is a condition that develops when small pouches (diverticula) form in the wall of the large intestine (colon). The colon is where water is absorbed and stool (feces) is formed. The pouches form when the inside layer of the colon pushes through weak spots in the outer layers of the colon. You may have a few pouches or many of them. The pouches usually do not cause problems unless they become inflamed or infected. When this happens, the condition is called diverticulitis. What are the causes? The cause of this condition is not known. What increases the risk? The following factors may make you more likely to develop this condition:  Being older than age 85. Your risk for this condition increases with age. Diverticulosis is rare among people younger than age 74. By age 20, many people have it.  Eating a low-fiber diet.  Having frequent constipation.  Being overweight.  Not getting enough exercise.  Smoking.  Taking over-the-counter pain medicines, like aspirin and ibuprofen.  Having a family history of diverticulosis. What are the signs or symptoms? In most people, there are no symptoms of this  condition. If you do have symptoms, they may include:  Bloating.  Cramps in the abdomen.  Constipation or diarrhea.  Pain in the lower left side of the abdomen. How is this diagnosed? Because diverticulosis usually has no symptoms, it is most often diagnosed during an exam for other colon problems. The condition may be diagnosed by:  Using a flexible scope to examine the colon (colonoscopy).  Taking an X-ray of the colon after dye has been put into the colon (barium enema).  Having a CT scan. How is this treated? You may not need treatment for this condition. Your health care provider may recommend treatment to prevent problems. You may need treatment if you have symptoms or if you previously had diverticulitis. Treatment may include:  Eating a high-fiber diet.  Taking a fiber supplement.  Taking a live bacteria supplement (probiotic).  Taking medicine to relax your colon. Follow these instructions at home: Medicines  Take over-the-counter and prescription medicines only as told by your health care provider.  If told by your health care provider, take a fiber supplement or probiotic. Constipation prevention Your condition may cause constipation. To prevent or treat constipation, you may need to:  Drink enough fluid to keep your urine pale yellow.  Take over-the-counter or prescription medicines.  Eat foods that are high in fiber, such as beans, whole grains, and fresh fruits and vegetables.  Limit foods that are high in fat and processed sugars, such as fried or sweet foods.  General instructions  Try not to strain when you have a bowel movement.  Keep all follow-up visits as told by your health care provider. This is important. Contact a health care provider if you:  Have pain in your abdomen.  Have bloating.  Have cramps.  Have not had a bowel movement in 3 days. Get help right away if:  Your pain gets worse.  Your bloating becomes very bad.  You have  a fever or chills, and your symptoms suddenly get worse.  You vomit.  You have bowel movements that are bloody or black.  You have bleeding from your rectum. Summary  Diverticulosis is a condition that develops when small pouches (diverticula) form in the wall of the large intestine (colon).  You may have a few pouches or many of them.  This condition is most often diagnosed during an exam for other colon problems.  Treatment may include increasing the fiber in your diet, taking supplements, or taking medicines. This information is not intended to replace advice given to you by your health care provider. Make sure you discuss any questions you have with your health care provider. Document Revised: 09/11/2018 Document Reviewed: 09/11/2018 Elsevier Patient Education  [place].  Colon Polyps  Polyps are tissue growths inside the body. Polyps can grow in many places, including the large intestine (colon). A polyp may be a round bump or a mushroom-shaped growth. You could have one polyp or several. Most colon polyps are noncancerous (benign). However, some colon polyps can become cancerous over time. Finding and removing the polyps early can help prevent this. What are the causes? The exact cause of colon polyps is not known. What increases the risk? You are more likely to develop this condition if you:  Have a family history of colon cancer or colon polyps.  Are older than 2 or older than 45 if you are African American.  Have inflammatory bowel disease, such as ulcerative colitis or Crohn's disease.  Have certain hereditary conditions, such as: ? Familial adenomatous polyposis. ? Lynch syndrome. ? Turcot syndrome. ? Peutz-Jeghers syndrome.  Are overweight.  Smoke cigarettes.  Do not get enough exercise.  Drink too much alcohol.  Eat a diet that is high in fat and red meat and low in fiber.  Had childhood cancer that was treated with abdominal radiation. What  are the signs or symptoms? Most polyps do not cause symptoms. If you have symptoms, they may include:  Blood coming from your rectum when having a  bowel movement.  Blood in your stool. The stool may look dark red or black.  Abdominal pain.  A change in bowel habits, such as constipation or diarrhea. How is this diagnosed? This condition is diagnosed with a colonoscopy. This is a procedure in which a lighted, flexible scope is inserted into the anus and then passed into the colon to examine the area. Polyps are sometimes found when a colonoscopy is done as part of routine cancer screening tests. How is this treated? Treatment for this condition involves removing any polyps that are found. Most polyps can be removed during a colonoscopy. Those polyps will then be tested for cancer. Additional treatment may be needed depending on the results of testing. Follow these instructions at home: Lifestyle  Maintain a healthy weight, or lose weight if recommended by your health care provider.  Exercise every day or as told by your health care provider.  Do not use any products that contain nicotine or tobacco, such as cigarettes and e-cigarettes. If you need help quitting, ask your health care provider.  If you drink alcohol, limit how much you have: ? 0-1 drink a day for women. ? 0-2 drinks a day for men.  Be aware of how much alcohol is in your drink. In the [place] one drink equals one 12 oz bottle of beer (355 mL), one 5 oz glass of wine (148 mL), or one 1 oz shot of hard liquor (44 mL). Eating and drinking   Eat foods that are high in fiber, such as fruits, vegetables, and whole grains.  Eat foods that are high in calcium and vitamin D, such as milk, cheese, yogurt, eggs, liver, fish, and broccoli.  Limit foods that are high in fat, such as fried foods and desserts.  Limit the amount of red meat and processed meat you eat, such as hot dogs, sausage, bacon, and lunch meats. General  instructions  Keep all follow-up visits as told by your health care provider. This is important. ? This includes having regularly scheduled colonoscopies. ? Talk to your health care provider about when you need a colonoscopy. Contact a health care provider if:  You have new or worsening bleeding during a bowel movement.  You have new or increased blood in your stool.  You have a change in bowel habits.  You lose weight for no known reason. Summary  Polyps are tissue growths inside the body. Polyps can grow in many places, including the colon.  Most colon polyps are noncancerous (benign), but some can become cancerous over time.  This condition is diagnosed with a colonoscopy.  Treatment for this condition involves removing any polyps that are found. Most polyps can be removed during a colonoscopy. This information is not intended to replace advice given to you by your health care provider. Make sure you discuss any questions you have with your health care provider. Document Revised: 05/30/2017 Document Reviewed: 05/30/2017 Elsevier Patient Education  [place].

## 2019-02-26 NOTE — H&P (Signed)
Noah Lawson is an 48 y.o. male.   Chief Complaint: Patient is here for colonoscopy. HPI: Patient 48 year old Caucasian male who is here for screening colonoscopy.  He denies abdominal pain change in bowel habits or rectal bleeding.  He did have diagnostic colonoscopy 10 years ago possibly was normal. Family history is positive for CRC in paternal grandfather.  Past Medical History:  Diagnosis Date  . Gilbert's syndrome    slightly  elevated bilirubin  . History of bronchitis    as a child  . Joint pain   . Joint swelling     Past Surgical History:  Procedure Laterality Date  . COLONOSCOPY      History reviewed. No pertinent family history. Social History:  reports that he has quit smoking. He has never used smokeless tobacco. He reports current alcohol use. He reports that he does not use drugs.  Allergies: No Known Allergies  Medications Prior to Admission  Medication Sig Dispense Refill  . amLODipine (NORVASC) 5 MG tablet Take 5 mg by mouth every evening.    . escitalopram (LEXAPRO) 10 MG tablet Take 10 mg by mouth daily.    Marland Kitchen olmesartan-hydrochlorothiazide (BENICAR HCT) 40-12.5 MG tablet Take 1 tablet by mouth daily.      No results found for this or any previous visit (from the past 48 hour(s)). No results found.  Review of Systems  Blood pressure (!) 140/97, pulse 85, temperature 98.4 F (36.9 C), temperature source Oral, resp. rate 16, height 6' (1.829 m), weight 99.3 kg, SpO2 98 %. Physical Exam  Constitutional: He appears well-developed and well-nourished.  HENT:  Mouth/Throat: Oropharynx is clear and moist.  Eyes: Conjunctivae are normal. No scleral icterus.  Neck: No thyromegaly present.  Cardiovascular: Normal rate, regular rhythm and normal heart sounds.  No murmur heard. Respiratory: Effort normal and breath sounds normal.  GI: Soft. He exhibits no distension and no mass. There is no abdominal tenderness.  Musculoskeletal:        General: No edema.   Lymphadenopathy:    He has no cervical adenopathy.  Neurological: He is alert.  Skin: Skin is warm and dry.     Assessment/Plan Average risk screening colonoscopy.  Hildred Laser, MD 02/26/2019, 12:01 PM

## 2019-03-02 LAB — SURGICAL PATHOLOGY

## 2019-03-07 ENCOUNTER — Other Ambulatory Visit: Payer: Self-pay

## 2019-03-07 ENCOUNTER — Ambulatory Visit (INDEPENDENT_AMBULATORY_CARE_PROVIDER_SITE_OTHER): Payer: Self-pay

## 2019-03-07 ENCOUNTER — Ambulatory Visit
Admission: EM | Admit: 2019-03-07 | Discharge: 2019-03-07 | Disposition: A | Discharge status: Home / Self Care | Payer: BC Managed Care – PPO | Attending: Emergency Medicine | Admitting: Emergency Medicine

## 2019-03-07 DIAGNOSIS — R000 Tachycardia, unspecified: Secondary | ICD-10-CM | POA: Diagnosis not present

## 2019-03-07 DIAGNOSIS — W19XXXA Unspecified fall, initial encounter: Secondary | ICD-10-CM

## 2019-03-07 DIAGNOSIS — R0781 Pleurodynia: Secondary | ICD-10-CM | POA: Diagnosis not present

## 2019-03-07 MED ORDER — NAPROXEN 375 MG PO TABS: 375.0000 mg | ORAL_TABLET | Freq: Two times a day (BID) | ORAL | 0 refills | Status: DC

## 2019-03-07 NOTE — Discharge Instructions (Addendum)
X-ray concerning for bibasilar atelectasis (partial lower lung collapse on both sides); this may be chronic or because you did not take a deep breath while x-ray was performed However, I am concerned for your elevated heart rate and bruising to abdomen.  This may be a sign of internal bleed.  This may be life-threatening.  Recommended further evaluation in the ED. Patient declines at this time.  Would like to try outpatient therapy first.    Continue conservative management of rest, ice, and gentle stretches Take naproxen as needed for pain relief (may cause abdominal discomfort, ulcers, and GI bleeds avoid taking with other NSAIDs or with lexapro) Go to the ER if you have any new or worsening symptoms (fever, chills, chest pain, shortness of breath, dizziness, nausea, vomiting, changes in bowel or bladder habits, worsening symptoms despite medication, etc...)

## 2019-03-07 NOTE — ED Provider Notes (Signed)
[place]   [id number] 03/07/19 Arrival Time: [id number]  CC: Rib pain  SUBJECTIVE: History from: patient. VATCHE BERMAN is a 49 y.o. male complains of LT sided rib pain last night.  Symptoms began after fall from ladder.  States he was standing approximately 4 ft off the ground and fell landing on left side.  Patient is 6 ft tall.  Localizes the pain to the outside of left ribs.  Describes the pain as intermittent and 5/10.  Has tried OTC medications without relief.  Symptoms are made worse to the touch and reaching left arm overhead.  Denies similar symptoms in the past. Complains of associated swelling over rib cage, and bruising to left lower abdomen. Denies fever, chills, chest pain, SOB, erythema, weakness.  ROS: As per HPI.  All other pertinent ROS negative.     Past Medical History:  Diagnosis Date  . Gilbert's syndrome    slightly  elevated bilirubin  . History of bronchitis    as a child  . Joint pain   . Joint swelling    Past Surgical History:  Procedure Laterality Date  . COLONOSCOPY    . COLONOSCOPY N/A 02/26/2019   Procedure: COLONOSCOPY;  Surgeon: Rogene Houston, MD;  Location: [facility];  Service: Endoscopy;  Laterality: N/A;  730am  . POLYPECTOMY  02/26/2019   Procedure: POLYPECTOMY;  Surgeon: Rogene Houston, MD;  Location: [facility];  Service: Endoscopy;;   No Known Allergies No current facility-administered medications on file prior to encounter.   Current Outpatient Medications on File Prior to Encounter  Medication Sig Dispense Refill  . amLODipine (NORVASC) 5 MG tablet Take 5 mg by mouth every evening.    . escitalopram (LEXAPRO) 10 MG tablet Take 10 mg by mouth daily.    Marland Kitchen olmesartan-hydrochlorothiazide (BENICAR HCT) 40-12.5 MG tablet Take 1 tablet by mouth daily.     Social History   Socioeconomic History  . Marital status: Married    Spouse name: Not on file  . Number of children: Not on file  . Years of education: Not on file   . Highest education level: Not on file  Occupational History  . Not on file  Tobacco Use  . Smoking status: Former Research scientist (life sciences)  . Smokeless tobacco: Never Used  . Tobacco comment: quit smoking in May 2013  Substance and Sexual Activity  . Alcohol use: Yes    Comment: weekend drinker  . Drug use: No  . Sexual activity: Not Currently  Other Topics Concern  . Not on file  Social History Narrative  . Not on file   Social Determinants of Health   Financial Resource Strain:   . Difficulty of Paying Living Expenses: Not on file  Food Insecurity:   . Worried About Charity fundraiser in the Last Year: Not on file  . Ran Out of Food in the Last Year: Not on file  Transportation Needs:   . Lack of Transportation (Medical): Not on file  . Lack of Transportation (Non-Medical): Not on file  Physical Activity:   . Days of Exercise per Week: Not on file  . Minutes of Exercise per Session: Not on file  Stress:   . Feeling of Stress : Not on file  Social Connections:   . Frequency of Communication with Friends and Family: Not on file  . Frequency of Social Gatherings with Friends and Family: Not on file  . Attends Religious Services: Not on file  . Active  Member of Clubs or Organizations: Not on file  . Attends Archivist Meetings: Not on file  . Marital Status: Not on file  Intimate Partner Violence:   . Fear of Current or Ex-Partner: Not on file  . Emotionally Abused: Not on file  . Physically Abused: Not on file  . Sexually Abused: Not on file   Family History  Problem Relation Age of Onset  . Healthy Mother   . Healthy Father     OBJECTIVE:  Vitals:   03/07/19 1205  BP: (!) 171/96  Pulse: (!) 126  Resp: 16  Temp: 98.7 F (37.1 C)  TempSrc: Oral  SpO2: 96%    General appearance: ALERT; appears uncomfortable; nontoxic, speaking in full sentences and tolerating own secretions Head: NCAT Lungs: Normal respiratory effort; CTAB CV: RRR Musculoskeletal:  Torso Inspection: No erythema or ecchymosis over rib cage Palpation: TTP over left lateral rib cage, TTP with AP and lateral compression; NTTP over spine or paravertebral muscles; NTTP over LT hip ROM: FROM active and passive Skin: warm and dry Abdomen: light ecchymosis over lateral lower left abdomen; soft, nondistended, normal active bowel sounds; nontender to palpation; no guarding  Neurologic: Ambulates with minimal difficulty; Sensation intact about the upper/ lower extremities Psychological: alert and cooperative; normal mood and affect  DIAGNOSTIC STUDIES:  DG Chest 2 View  Result Date: 03/07/2019 CLINICAL DATA:  Pain following fall EXAM: CHEST - 2 VIEW COMPARISON:  None. FINDINGS: There is mild bibasilar atelectasis. Lungs elsewhere clear. Heart size and pulmonary vascularity are normal. No adenopathy. No pneumothorax. No bone lesions. IMPRESSION: Bibasilar atelectasis. Lungs elsewhere clear. Cardiac silhouette normal. No adenopathy. Electronically Signed   By: Lowella Grip III M.D.   On: 03/07/2019 12:23    X-rays for bibasilar atelectasis  I have reviewed the x-rays myself and the radiologist interpretation. I am in agreement with the radiologist interpretation.     ASSESSMENT & PLAN:  1. Fall, initial encounter   2. Rib pain on left side   3. Tachycardia     Meds ordered this encounter  Medications  . naproxen (NAPROSYN) 375 MG tablet    Sig: Take 1 tablet (375 mg total) by mouth 2 (two) times daily.    Dispense:  20 tablet    Refill:  0    Order Specific Question:   Supervising Provider    Answer:   Raylene Everts [id number]   X-ray concerning for bibasilar atelectasis (partial lower lung collapse on both sides); this may be chronic or because you did not take a deep breath while x-ray was performed. However, I am concerned for your elevated heart rate and bruising to abdomen.  This may be a sign of internal bleed.  This may be life-threatening.  Recommended  further evaluation in the ED. Patient declines at this time.  Would like to try outpatient therapy first.    Continue conservative management of rest, ice, and gentle stretches Take naproxen as needed for pain relief (may cause abdominal discomfort, ulcers, and GI bleeds avoid taking with other NSAIDs) Return or go to the ER if you have any new or worsening symptoms (fever, chills, chest pain, shortness of breath, dizziness, nausea, vomiting, changes in bowel or bladder habits, worsening symptoms despite medication, etc...)   Reviewed expectations re: course of current medical issues. Questions answered. Outlined signs and symptoms indicating need for more acute intervention. Patient verbalized understanding. After Visit Summary given.    Lestine Box, PA-C 03/07/19 1651

## 2019-03-07 NOTE — ED Triage Notes (Addendum)
Pt presents to UC w/ c/o fall yesterday. Pt states he fell 3-4 feet up on the ladder and fell onto concrete onto his left side and hip. Pt currently has pain in left ribs and left hip.

## 2019-04-22 DIAGNOSIS — U071 COVID-19: Secondary | ICD-10-CM | POA: Diagnosis not present

## 2019-04-22 DIAGNOSIS — Z20828 Contact with and (suspected) exposure to other viral communicable diseases: Secondary | ICD-10-CM | POA: Diagnosis not present

## 2019-09-22 DIAGNOSIS — Z20828 Contact with and (suspected) exposure to other viral communicable diseases: Secondary | ICD-10-CM | POA: Diagnosis not present

## 2020-12-07 ENCOUNTER — Other Ambulatory Visit (HOSPITAL_COMMUNITY): Payer: Self-pay

## 2021-03-14 DIAGNOSIS — Z0001 Encounter for general adult medical examination with abnormal findings: Secondary | ICD-10-CM | POA: Diagnosis not present

## 2021-03-14 DIAGNOSIS — I10 Essential (primary) hypertension: Secondary | ICD-10-CM | POA: Diagnosis not present

## 2021-03-16 DIAGNOSIS — Z0001 Encounter for general adult medical examination with abnormal findings: Secondary | ICD-10-CM | POA: Diagnosis not present

## 2021-03-30 DIAGNOSIS — I10 Essential (primary) hypertension: Secondary | ICD-10-CM | POA: Diagnosis not present

## 2021-11-03 DIAGNOSIS — I10 Essential (primary) hypertension: Secondary | ICD-10-CM | POA: Diagnosis not present

## 2021-11-21 DIAGNOSIS — R748 Abnormal levels of other serum enzymes: Secondary | ICD-10-CM | POA: Diagnosis not present

## 2021-11-21 DIAGNOSIS — R7301 Impaired fasting glucose: Secondary | ICD-10-CM | POA: Diagnosis not present

## 2021-11-21 DIAGNOSIS — E785 Hyperlipidemia, unspecified: Secondary | ICD-10-CM | POA: Diagnosis not present

## 2021-11-21 DIAGNOSIS — I10 Essential (primary) hypertension: Secondary | ICD-10-CM | POA: Diagnosis not present

## 2022-06-15 DIAGNOSIS — R7301 Impaired fasting glucose: Secondary | ICD-10-CM | POA: Diagnosis not present

## 2022-06-15 DIAGNOSIS — I10 Essential (primary) hypertension: Secondary | ICD-10-CM | POA: Diagnosis not present

## 2022-06-22 ENCOUNTER — Other Ambulatory Visit (HOSPITAL_COMMUNITY): Payer: Self-pay | Admitting: Internal Medicine

## 2022-06-22 DIAGNOSIS — E785 Hyperlipidemia, unspecified: Secondary | ICD-10-CM | POA: Diagnosis not present

## 2022-06-22 DIAGNOSIS — R7301 Impaired fasting glucose: Secondary | ICD-10-CM | POA: Diagnosis not present

## 2022-06-22 DIAGNOSIS — I10 Essential (primary) hypertension: Secondary | ICD-10-CM | POA: Diagnosis not present

## 2022-06-22 DIAGNOSIS — R7401 Elevation of levels of liver transaminase levels: Secondary | ICD-10-CM | POA: Diagnosis not present

## 2022-06-22 DIAGNOSIS — R748 Abnormal levels of other serum enzymes: Secondary | ICD-10-CM | POA: Diagnosis not present

## 2022-08-22 ENCOUNTER — Ambulatory Visit (HOSPITAL_COMMUNITY)
Admission: RE | Admit: 2022-08-22 | Discharge: 2022-08-22 | Disposition: A | Discharge status: Home / Self Care | Payer: BC Managed Care – PPO | Source: Ambulatory Visit | Attending: Internal Medicine | Admitting: Internal Medicine

## 2022-08-22 DIAGNOSIS — E785 Hyperlipidemia, unspecified: Secondary | ICD-10-CM | POA: Insufficient documentation

## 2024-02-13 ENCOUNTER — Encounter (INDEPENDENT_AMBULATORY_CARE_PROVIDER_SITE_OTHER): Payer: Self-pay | Admitting: *Deleted

## 2024-03-18 ENCOUNTER — Telehealth (INDEPENDENT_AMBULATORY_CARE_PROVIDER_SITE_OTHER): Payer: Self-pay

## 2024-03-18 NOTE — Telephone Encounter (Signed)
 Who is your primary care physician: Norleen Hurst, MD  Reasons for the colonoscopy: history of colon polyps, family history colon cancer, screening  Have you had a colonoscopy before?  Yes, 2010 and 2020  Do you have family history of colon cancer? Yes, grandfather  Previous colonoscopy with polyps removed? Yes, 02/26/2019  Do you have a history colorectal cancer?   no  Are you diabetic? If yes, Type 1 or Type 2?    no  Do you have a prosthetic or mechanical heart valve? no  Do you have a pacemaker/defibrillator?   no  Have you had endocarditis/atrial fibrillation? no  Have you had joint replacement within the last 12 months?  no  Do you tend to be constipated or have to use laxatives? no  Do you have any history of drugs or alcohol?  yes  Do you use supplemental oxygen?  no  Have you had a stroke or heart attack within the last 6 months? no  Do you take weight loss medication?  no  Do you take any blood-thinning medications such as: (aspirin, warfarin, Plavix, Aggrenox)  no  If yes we need the name, milligram, dosage and who is prescribing doctor   Current Outpatient Medications  Medication Sig Dispense Refill   amLODipine (NORVASC) 5 MG tablet Take 5 mg by mouth every evening.     atorvastatin (LIPITOR) 10 MG tablet Take 10 mg by mouth daily.     olmesartan-hydrochlorothiazide (BENICAR HCT) 40-12.5 MG tablet Take 1 tablet by mouth daily.     No current facility-administered medications for this visit.    Allergies[1]  Pharmacy: [facility]  Primary Insurance Name: Sherleen Paterson number where you can be reached: [phone]     [1] No Known Allergies

## 2024-03-18 NOTE — Telephone Encounter (Signed)
Ok to schedule.  Room :Any   Thanks,  Vista Lawman, MD [facility]ology and [facility]

## 2024-03-19 NOTE — Telephone Encounter (Signed)
 LMOVM to call back

## 2024-03-24 ENCOUNTER — Encounter: Payer: Self-pay | Admitting: *Deleted

## 2024-03-24 MED ORDER — PEG 3350-KCL-NA BICARB-NACL 420 G PO SOLR: 4000.0000 mL | Freq: Once | ORAL | 0 refills | Status: AC

## 2024-03-24 NOTE — Addendum Note (Signed)
 Addended by: GAYLENE MADELIN CROME on: 03/24/2024 04:26 PM   Modules accepted: Orders

## 2024-03-24 NOTE — Telephone Encounter (Signed)
 Pt has been scheduled for 04/24/24. Instructions mailed and prep sent to pharmacy.

## 2024-03-25 NOTE — Telephone Encounter (Signed)
 Questionnaire from recall, no referral needed

## 2024-04-22 ENCOUNTER — Other Ambulatory Visit (HOSPITAL_COMMUNITY)

## 2024-04-24 ENCOUNTER — Ambulatory Visit (HOSPITAL_COMMUNITY): Admit: 2024-04-24 | Admitting: Gastroenterology

## 2024-04-24 ENCOUNTER — Encounter (HOSPITAL_COMMUNITY): Payer: Self-pay
